# Patient Record
Sex: Female | Born: 2005 | Race: Black or African American | Hispanic: No | Marital: Single
Health system: Southern US, Community
[De-identification: ages and names within clinical notes are randomized; demographics above are authoritative.]

## PROBLEM LIST (undated history)

## (undated) HISTORY — PX: TONSILLECTOMY: SUR1361

## (undated) HISTORY — PX: ADENOIDECTOMY: SUR15

---
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
## 2023-05-11 IMAGING — CR DG CHEST 2V
2 series · 2 of 2 positions shown · non-contrast
Comparison: 05/12/2006

CLINICAL DATA: Chest pain

EXAM:
CHEST - 2 VIEW

[chest pa]
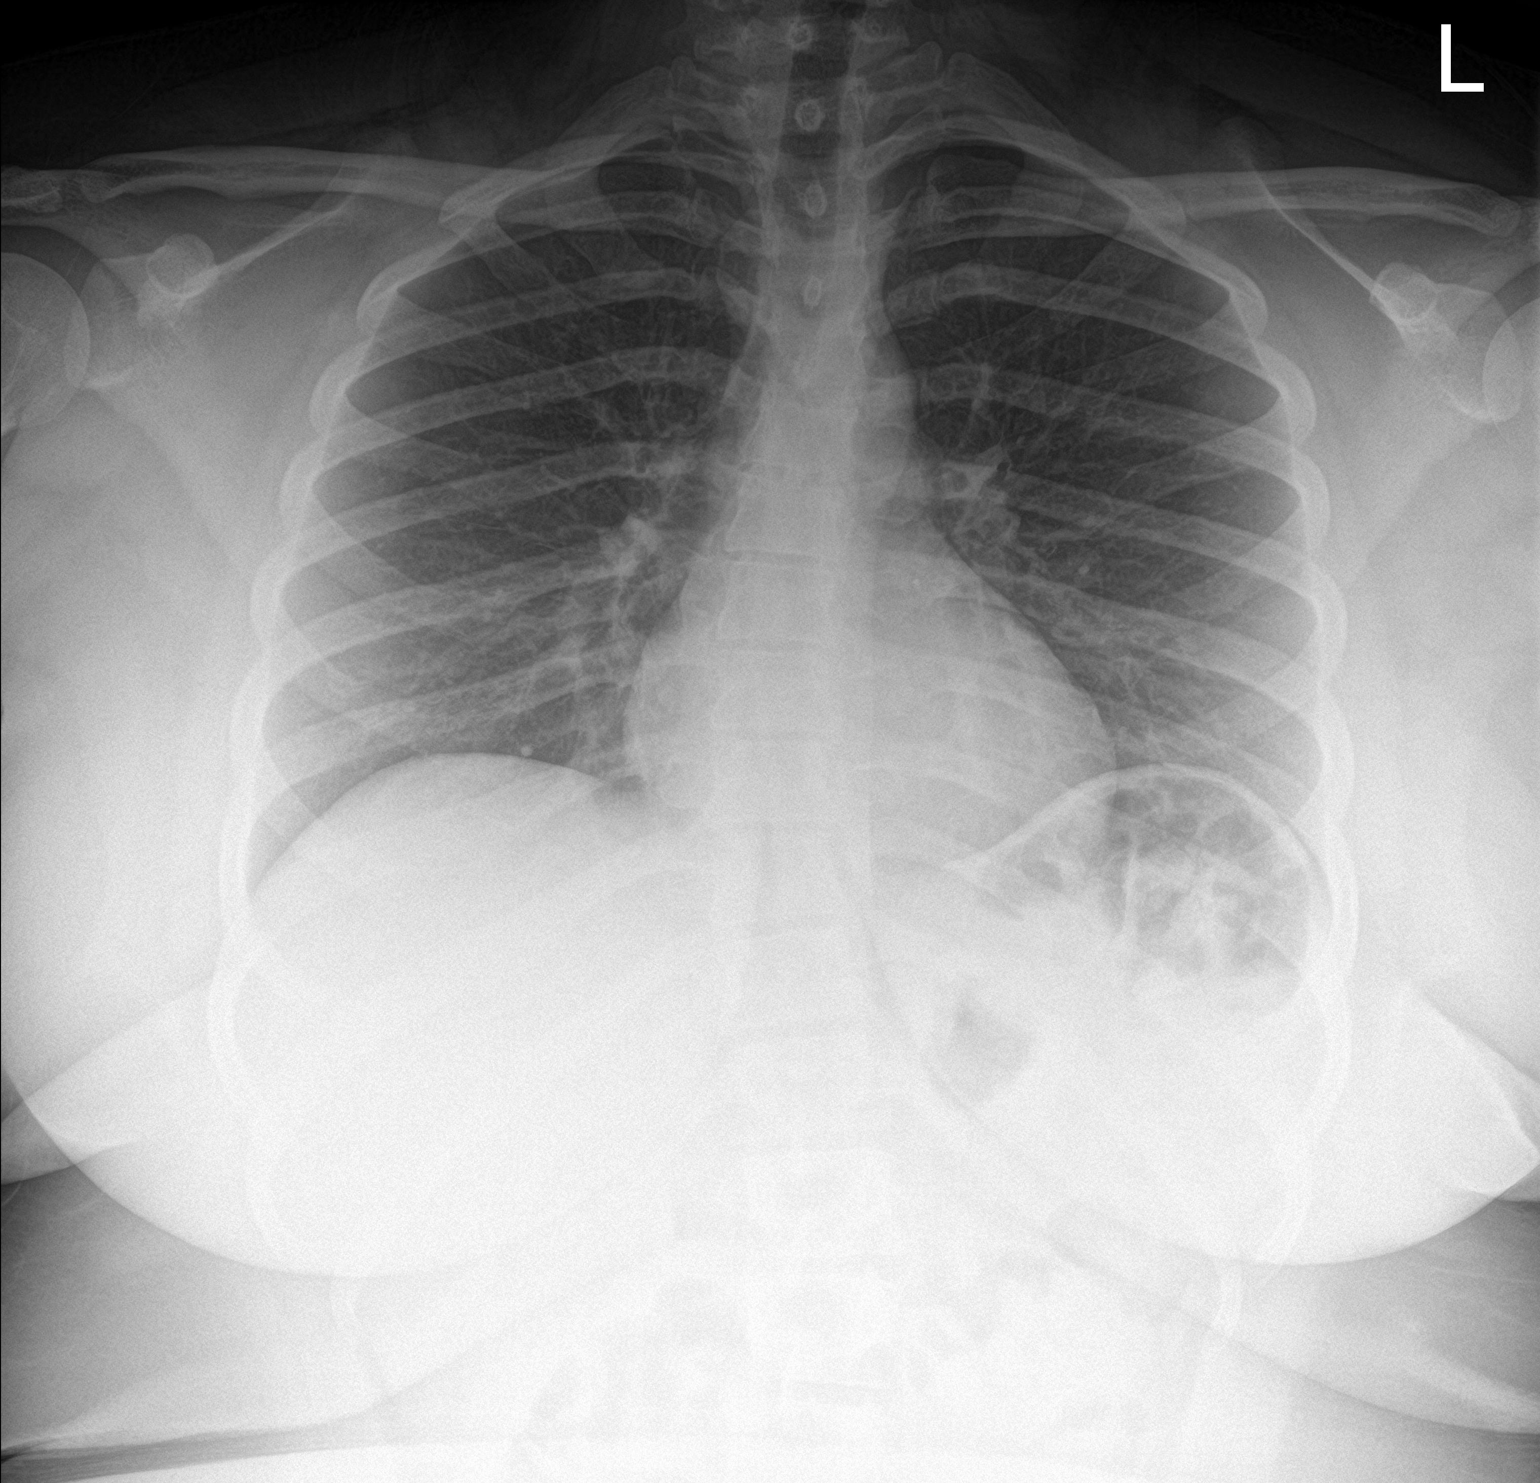

[chest lat]
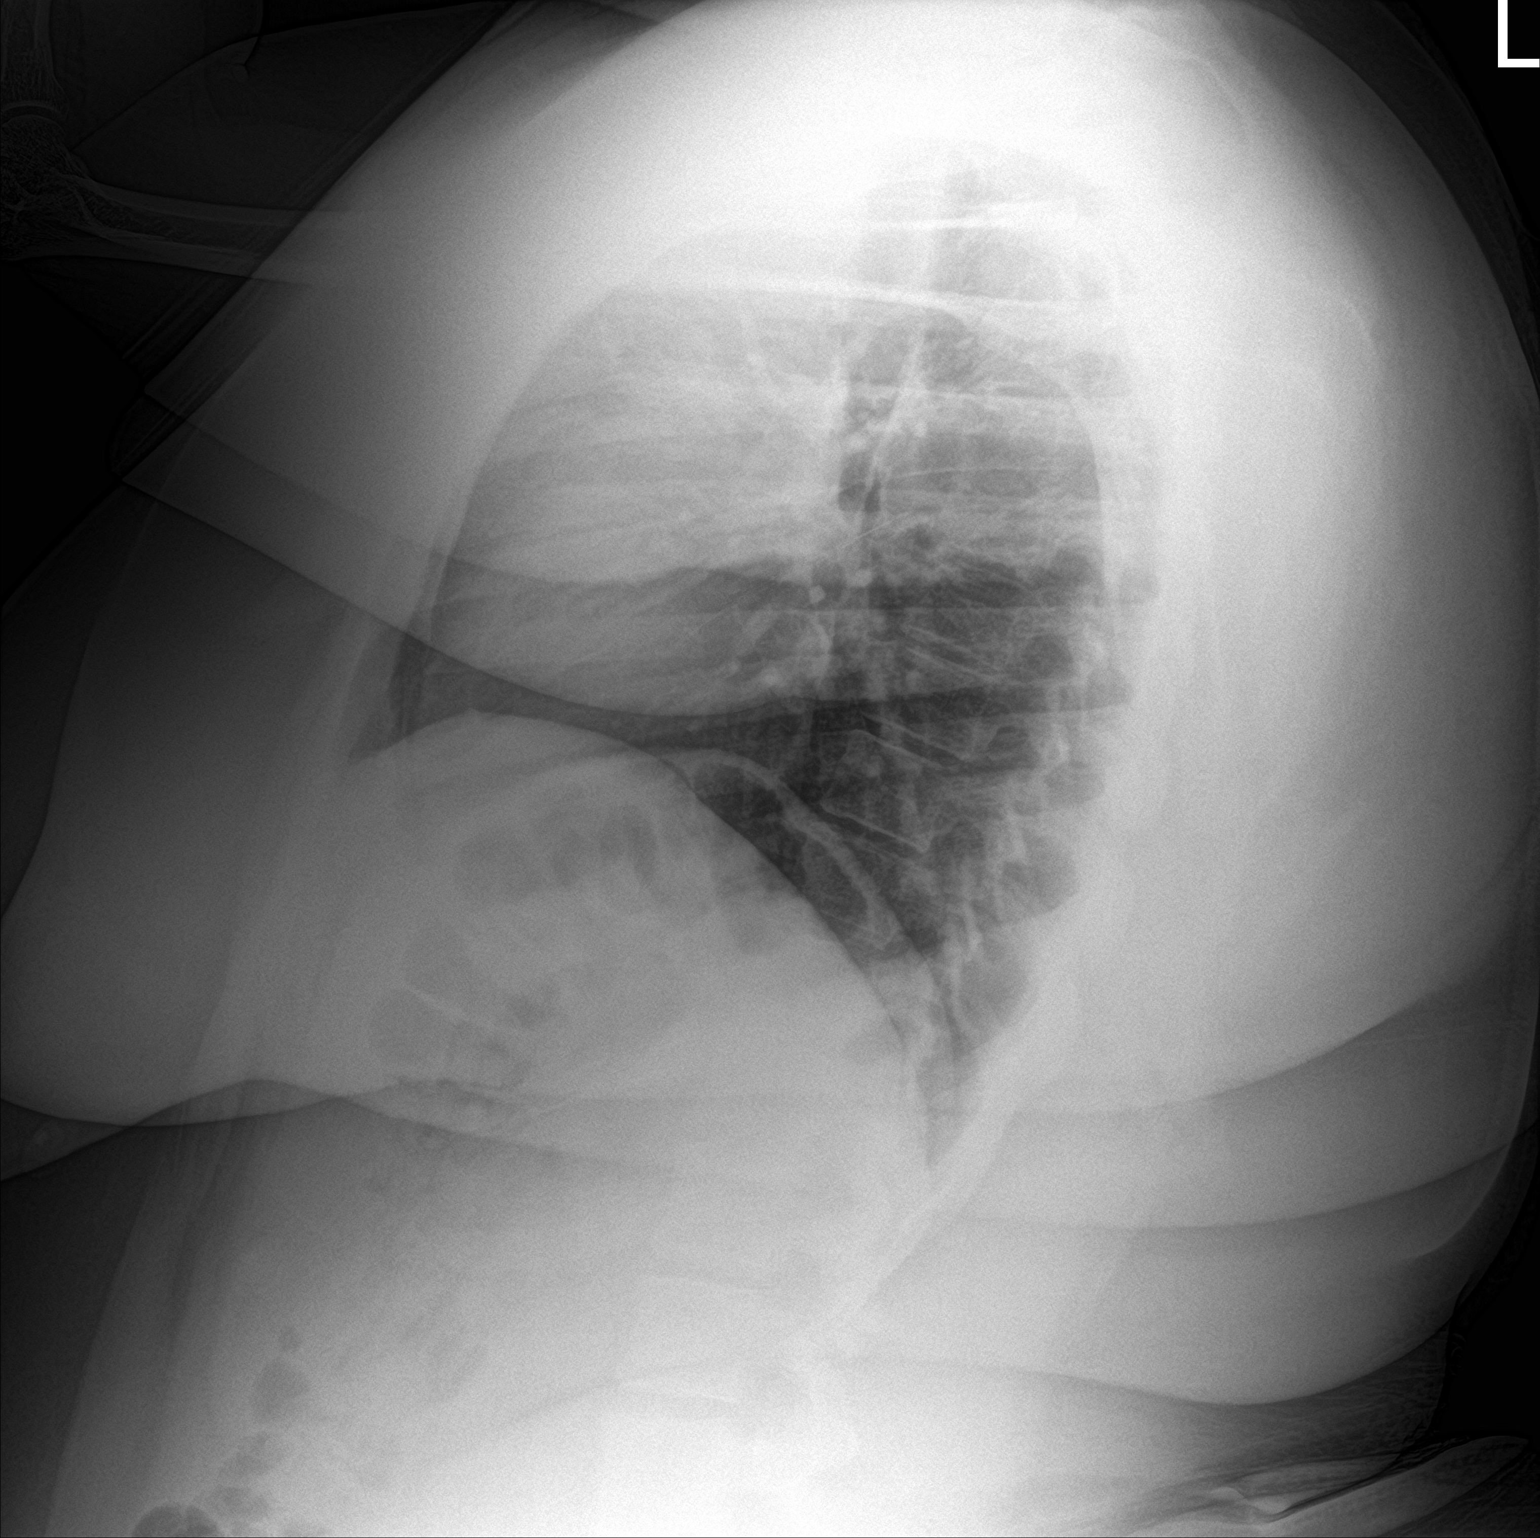

[2 of 2 positions shown; findings below may reference images not displayed]

FINDINGS: The heart size and mediastinal contours are within normal limits.
Both lungs are clear. The visualized skeletal structures are
unremarkable.
IMPRESSION: No active cardiopulmonary disease.

## 2024-02-19 ENCOUNTER — Ambulatory Visit: Admitting: Family Medicine
